# Patient Record
Sex: Female | Born: 2005 | Race: White | Hispanic: No | Marital: Single | Smoking: Never smoker
Health system: Southern US, Community
[De-identification: ages and names within clinical notes are randomized; demographics above are authoritative.]

---
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
## 2023-01-08 DIAGNOSIS — R519 Headache, unspecified: Secondary | ICD-10-CM | POA: Diagnosis not present

## 2023-01-08 DIAGNOSIS — R3989 Other symptoms and signs involving the genitourinary system: Secondary | ICD-10-CM | POA: Diagnosis not present

## 2023-01-08 DIAGNOSIS — R635 Abnormal weight gain: Secondary | ICD-10-CM | POA: Diagnosis not present

## 2023-01-08 DIAGNOSIS — R42 Dizziness and giddiness: Secondary | ICD-10-CM | POA: Diagnosis not present

## 2023-01-10 DIAGNOSIS — Z3202 Encounter for pregnancy test, result negative: Secondary | ICD-10-CM | POA: Diagnosis not present

## 2023-01-10 DIAGNOSIS — Z30430 Encounter for insertion of intrauterine contraceptive device: Secondary | ICD-10-CM | POA: Diagnosis not present

## 2023-02-19 DIAGNOSIS — L239 Allergic contact dermatitis, unspecified cause: Secondary | ICD-10-CM | POA: Diagnosis not present

## 2023-03-07 DIAGNOSIS — Z30431 Encounter for routine checking of intrauterine contraceptive device: Secondary | ICD-10-CM | POA: Diagnosis not present

## 2023-11-26 DIAGNOSIS — Z01419 Encounter for gynecological examination (general) (routine) without abnormal findings: Secondary | ICD-10-CM | POA: Diagnosis not present
